# Patient Record
Sex: Male | Born: 1995 | Race: White | Hispanic: No | Marital: Single | Smoking: Never smoker
Health system: Southern US, Community
[De-identification: ages and names within clinical notes are randomized; demographics above are authoritative.]

## PROBLEM LIST (undated history)

## (undated) DIAGNOSIS — F840 Autistic disorder: Secondary | ICD-10-CM

## (undated) DIAGNOSIS — D169 Benign neoplasm of bone and articular cartilage, unspecified: Secondary | ICD-10-CM

## (undated) HISTORY — PX: DENTAL SURGERY: SHX609

## (undated) HISTORY — PX: OTHER SURGICAL HISTORY: SHX169

---
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
## 2017-02-18 DIAGNOSIS — H6121 Impacted cerumen, right ear: Secondary | ICD-10-CM | POA: Diagnosis not present

## 2017-03-28 DIAGNOSIS — R21 Rash and other nonspecific skin eruption: Secondary | ICD-10-CM | POA: Diagnosis not present

## 2017-09-06 DIAGNOSIS — H1032 Unspecified acute conjunctivitis, left eye: Secondary | ICD-10-CM | POA: Diagnosis not present

## 2017-09-06 DIAGNOSIS — H6123 Impacted cerumen, bilateral: Secondary | ICD-10-CM | POA: Diagnosis not present

## 2017-10-07 ENCOUNTER — Other Ambulatory Visit: Payer: Self-pay

## 2017-10-07 ENCOUNTER — Encounter (HOSPITAL_COMMUNITY): Payer: Self-pay

## 2017-10-07 ENCOUNTER — Emergency Department (HOSPITAL_COMMUNITY)
Admission: EM | Admit: 2017-10-07 | Discharge: 2017-10-07 | Disposition: A | Discharge status: Home / Self Care | Payer: 59 | Attending: Emergency Medicine | Admitting: Emergency Medicine

## 2017-10-07 DIAGNOSIS — T7840XA Allergy, unspecified, initial encounter: Secondary | ICD-10-CM

## 2017-10-07 DIAGNOSIS — T7805XA Anaphylactic reaction due to tree nuts and seeds, initial encounter: Secondary | ICD-10-CM | POA: Diagnosis not present

## 2017-10-07 DIAGNOSIS — Z91010 Allergy to peanuts: Secondary | ICD-10-CM | POA: Insufficient documentation

## 2017-10-07 DIAGNOSIS — F840 Autistic disorder: Secondary | ICD-10-CM | POA: Diagnosis not present

## 2017-10-07 HISTORY — DX: Autistic disorder: F84.0

## 2017-10-07 HISTORY — DX: Benign neoplasm of bone and articular cartilage, unspecified: D16.9

## 2017-10-07 MED ORDER — PREDNISONE 20 MG PO TABS: 40.0000 mg | ORAL_TABLET | Freq: Every day | ORAL | 0 refills | Status: AC

## 2017-10-07 MED ORDER — PREDNISONE 20 MG PO TABS
40.0000 mg | ORAL_TABLET | Freq: Once | ORAL | Status: AC
  Administered 2017-10-07: 40 mg via ORAL
  Filled 2017-10-07: qty 2

## 2017-10-07 MED ORDER — FAMOTIDINE 20 MG PO TABS
20.0000 mg | ORAL_TABLET | Freq: Once | ORAL | Status: AC
  Administered 2017-10-07: 20 mg via ORAL
  Filled 2017-10-07: qty 1

## 2017-10-07 MED ORDER — DIPHENHYDRAMINE HCL 25 MG PO CAPS
25.0000 mg | ORAL_CAPSULE | Freq: Once | ORAL | Status: AC
  Administered 2017-10-07: 25 mg via ORAL
  Filled 2017-10-07: qty 1

## 2017-10-07 MED ORDER — EPINEPHRINE 0.3 MG/0.3ML IJ SOAJ: 0.3000 mg | Freq: Once | INTRAMUSCULAR | 0 refills | Status: AC

## 2017-10-07 NOTE — ED Provider Notes (Signed)
[facility] DEPT Provider Note   CSN: [id number] Arrival date & time: 10/07/17  1629     History   Chief Complaint Chief Complaint  Patient presents with  . Allergic Reaction    HPI William Herrera is a 21 y.o. male.  HPI  21 y.o. male with a hx of Autism, presents to the Emergency Department today due to allergic reaction. States that around 1530 he was at work and saw patient eating cashews, which he is allergic to, and start using equipment. Notes using same equipment with hands and started to develop hives on BUE as well as urticarial rash on torso and anterior neck. Denies throat swelling or shortness of breath. Pt states he did not ingest said cashews either. Pt did not take any medications PTA. States symptoms are slowly improving. Denies CP/SOB/ABD pain. No fevers. No neck stiffness or swelling. Pt without Epi pen at this time. No other symptoms noted    Past Medical History:  Diagnosis Date  . Autism    high functioning  . Bone tumor (benign)     There are no active problems to display for this patient.   Past Surgical History:  Procedure Laterality Date  . DENTAL SURGERY    . removal of bone tumors         Home Medications    Prior to Admission medications   Not on File    Family History Family History  Problem Relation Age of Onset  . Crohn's disease Mother   . Hypertension Father     Social History Social History   Tobacco Use  . Smoking status: Never Smoker  . Smokeless tobacco: Never Used  Substance Use Topics  . Alcohol use: No    Frequency: Never  . Drug use: No     Allergies   Cashew nut oil; Other; and Peanut-containing drug products   Review of Systems Review of Systems ROS reviewed and all are negative for acute change except as noted in the HPI.  Physical Exam Updated Vital Signs BP 130/80 (BP Location: Left Arm)   Pulse 100   Temp 97.7 F (36.5 C) (Oral)   Resp 18   Ht 5\' 5"  (1.651 m)    Wt 54.4 kg (120 lb)   SpO2 100%   BMI 19.97 kg/m   Physical Exam  Constitutional: He is oriented to person, place, and time. He appears well-developed and well-nourished. No distress.  HENT:  Head: Normocephalic and atraumatic.  Right Ear: Tympanic membrane, external ear and ear canal normal.  Left Ear: Tympanic membrane, external ear and ear canal normal.  Nose: Nose normal.  Mouth/Throat: Uvula is midline, oropharynx is clear and moist and mucous membranes are normal. No trismus in the jaw. No oropharyngeal exudate, posterior oropharyngeal erythema or tonsillar abscesses.  No posterior oropharyngeal swelling. Speaking in clear sentences   Eyes: EOM are normal. Pupils are equal, round, and reactive to light.  Neck: Normal range of motion. Neck supple. No tracheal deviation present.  Cardiovascular: Normal rate, regular rhythm, S1 normal, S2 normal, normal heart sounds, intact distal pulses and normal pulses.  Pulmonary/Chest: Effort normal and breath sounds normal. No respiratory distress. He has no decreased breath sounds. He has no wheezes. He has no rhonchi. He has no rales.  Abdominal: Normal appearance and bowel sounds are normal. There is no tenderness.  Musculoskeletal: Normal range of motion.  Neurological: He is alert and oriented to person, place, and time.  Skin: Skin is warm  and dry.  Urticarial rash noted on anterior chest wall   Psychiatric: He has a normal mood and affect. His speech is normal and behavior is normal. Thought content normal.  Vitals reviewed.    ED Treatments / Results  Labs (all labs ordered are listed, but only abnormal results are displayed) Labs Reviewed - No data to display  EKG  EKG Interpretation None       Radiology No results found.  Procedures Procedures (including critical care time)  Medications Ordered in ED Medications - No data to display   Initial Impression / Assessment and Plan / ED Course  I have reviewed the  triage vital signs and the nursing notes.  Pertinent labs & imaging results that were available during my care of the patient were reviewed by me and considered in my medical decision making (see chart for details).  Final Clinical Impressions(s) / ED Diagnoses     {I have reviewed the relevant previous healthcare records.  {I obtained HPI from historian.   ED Course:  Assessment: Pt is a 21 y.o. male with a hx of Autism, presents to the Emergency Department today due to allergic reaction. States that around 1530 he was at work and saw patient eating cashews, which he is allergic to, and start using equipment. Notes using same equipment with hands and started to develop hives on BUE as well as urticarial rash on torso and anterior neck. Denies throat swelling or shortness of breath. Pt states he did not ingest said cashews either. Pt did not take any medications PTA. States symptoms are slowly improving. Denies CP/SOB/ABD pain. No fevers. No neck stiffness or swelling. Pt without Epi pen at this time. On exam, pt in NAD. Nontoxic/nonseptic appearing. VSS. Afebrile. No posterior oropharyngeal swelling. Speaking in clear sentences.  Lungs CTA. Heart RRR. Abdomen nontender soft. Given prednisone, benadryl and famotidine in ED. Observed with improvement. Plan is to DC home with steroids and epi pen. At time of discharge, Patient is in no acute distress. Vital Signs are stable. Patient is able to ambulate. Patient able to tolerate PO.   Disposition/Plan:  DC Home Additional Verbal discharge instructions given and discussed with patient.  Pt Instructed to f/u with PCP in the next week for evaluation and treatment of symptoms. Return precautions given Pt acknowledges and agrees with plan  Supervising Physician Valarie Merino, MD  Final diagnoses:  Allergic reaction, initial encounter    ED Discharge Orders    None       Shary Decamp, PA-C 10/07/17 1816    Valarie Merino, MD 10/07/17  2329

## 2017-10-07 NOTE — ED Triage Notes (Signed)
Patient c/o allergic reaction at 1530. Patient has hives, slight lip swelling, and feels like he has resistance when he exhales. Patient has redness of the neck, and some on the trunk.

## 2017-10-07 NOTE — Discharge Instructions (Signed)
Please read and follow all provided instructions.  Your diagnoses today include:  1. Allergic reaction, initial encounter     Tests performed today include: Vital signs. See below for your results today.   Medications prescribed:  Take as prescribed   Home care instructions:  Follow any educational materials contained in this packet.  Follow-up instructions: Please follow-up with your primary care provider for further evaluation of symptoms and treatment   Return instructions:  Please return to the Emergency Department if you do not get better, if you get worse, or new symptoms OR  - Fever (temperature greater than 101.41F)  - Bleeding that does not stop with holding pressure to the area    -Severe pain (please note that you may be more sore the day after your accident)  - Chest Pain  - Difficulty breathing  - Severe nausea or vomiting  - Inability to tolerate food and liquids  - Passing out  - Skin becoming red around your wounds  - Change in mental status (confusion or lethargy)  - New numbness or weakness    Please return if you have any other emergent concerns.  Additional Information:  Your vital signs today were: BP 130/80 (BP Location: Left Arm)    Pulse 100    Temp 97.7 F (36.5 C) (Oral)    Resp 18    Ht 5\' 5"  (1.651 m)    Wt 54.4 kg (120 lb)    SpO2 100%    BMI 19.97 kg/m  If your blood pressure (BP) was elevated above 135/85 this visit, please have this repeated by your doctor within one month. ---------------

## 2017-10-31 DIAGNOSIS — J069 Acute upper respiratory infection, unspecified: Secondary | ICD-10-CM | POA: Diagnosis not present

## 2017-10-31 DIAGNOSIS — H9203 Otalgia, bilateral: Secondary | ICD-10-CM | POA: Diagnosis not present

## 2018-02-04 DIAGNOSIS — H6121 Impacted cerumen, right ear: Secondary | ICD-10-CM | POA: Diagnosis not present

## 2018-02-04 DIAGNOSIS — H6122 Impacted cerumen, left ear: Secondary | ICD-10-CM | POA: Diagnosis not present

## 2018-02-04 DIAGNOSIS — R197 Diarrhea, unspecified: Secondary | ICD-10-CM | POA: Diagnosis not present

## 2018-04-01 DIAGNOSIS — M25551 Pain in right hip: Secondary | ICD-10-CM | POA: Diagnosis not present

## 2018-04-01 DIAGNOSIS — M25561 Pain in right knee: Secondary | ICD-10-CM | POA: Diagnosis not present

## 2018-04-01 DIAGNOSIS — M25562 Pain in left knee: Secondary | ICD-10-CM | POA: Diagnosis not present

## 2018-04-08 DIAGNOSIS — M222X2 Patellofemoral disorders, left knee: Secondary | ICD-10-CM | POA: Diagnosis not present

## 2018-05-21 DIAGNOSIS — Z68.41 Body mass index (BMI) pediatric, 5th percentile to less than 85th percentile for age: Secondary | ICD-10-CM | POA: Diagnosis not present

## 2018-05-21 DIAGNOSIS — Z0001 Encounter for general adult medical examination with abnormal findings: Secondary | ICD-10-CM | POA: Diagnosis not present

## 2018-05-21 DIAGNOSIS — Z713 Dietary counseling and surveillance: Secondary | ICD-10-CM | POA: Diagnosis not present

## 2018-06-04 DIAGNOSIS — Z111 Encounter for screening for respiratory tuberculosis: Secondary | ICD-10-CM | POA: Diagnosis not present

## 2018-08-05 DIAGNOSIS — J309 Allergic rhinitis, unspecified: Secondary | ICD-10-CM | POA: Diagnosis not present

## 2018-08-05 DIAGNOSIS — H6121 Impacted cerumen, right ear: Secondary | ICD-10-CM | POA: Diagnosis not present

## 2018-08-05 DIAGNOSIS — H6093 Unspecified otitis externa, bilateral: Secondary | ICD-10-CM | POA: Diagnosis not present

## 2018-12-18 DIAGNOSIS — F840 Autistic disorder: Secondary | ICD-10-CM | POA: Diagnosis not present

## 2019-01-06 DIAGNOSIS — F840 Autistic disorder: Secondary | ICD-10-CM | POA: Diagnosis not present

## 2019-01-09 DIAGNOSIS — H6123 Impacted cerumen, bilateral: Secondary | ICD-10-CM | POA: Diagnosis not present

## 2019-01-22 DIAGNOSIS — F40218 Other animal type phobia: Secondary | ICD-10-CM | POA: Diagnosis not present

## 2019-01-22 DIAGNOSIS — F840 Autistic disorder: Secondary | ICD-10-CM | POA: Diagnosis not present

## 2019-06-04 DIAGNOSIS — J029 Acute pharyngitis, unspecified: Secondary | ICD-10-CM | POA: Diagnosis not present

## 2019-06-04 DIAGNOSIS — J309 Allergic rhinitis, unspecified: Secondary | ICD-10-CM | POA: Diagnosis not present

## 2019-07-28 DIAGNOSIS — R194 Change in bowel habit: Secondary | ICD-10-CM | POA: Diagnosis not present

## 2019-07-28 DIAGNOSIS — R140 Abdominal distension (gaseous): Secondary | ICD-10-CM | POA: Diagnosis not present

## 2019-07-28 DIAGNOSIS — R740 Nonspecific elevation of levels of transaminase and lactic acid dehydrogenase [LDH]: Secondary | ICD-10-CM | POA: Diagnosis not present

## 2019-07-28 DIAGNOSIS — K219 Gastro-esophageal reflux disease without esophagitis: Secondary | ICD-10-CM | POA: Diagnosis not present

## 2019-07-28 DIAGNOSIS — Z8379 Family history of other diseases of the digestive system: Secondary | ICD-10-CM | POA: Diagnosis not present

## 2019-07-31 DIAGNOSIS — R140 Abdominal distension (gaseous): Secondary | ICD-10-CM | POA: Diagnosis not present

## 2019-07-31 DIAGNOSIS — K219 Gastro-esophageal reflux disease without esophagitis: Secondary | ICD-10-CM | POA: Diagnosis not present

## 2019-07-31 DIAGNOSIS — R194 Change in bowel habit: Secondary | ICD-10-CM | POA: Diagnosis not present

## 2019-07-31 DIAGNOSIS — Z8379 Family history of other diseases of the digestive system: Secondary | ICD-10-CM | POA: Diagnosis not present

## 2019-08-04 ENCOUNTER — Other Ambulatory Visit: Payer: Self-pay | Admitting: Gastroenterology

## 2019-08-04 DIAGNOSIS — R7989 Other specified abnormal findings of blood chemistry: Secondary | ICD-10-CM

## 2019-08-06 ENCOUNTER — Ambulatory Visit
Admission: RE | Admit: 2019-08-06 | Discharge: 2019-08-06 | Disposition: A | Discharge status: Home / Self Care | Payer: BC Managed Care – PPO | Source: Ambulatory Visit | Attending: Gastroenterology | Admitting: Gastroenterology

## 2019-08-06 DIAGNOSIS — K760 Fatty (change of) liver, not elsewhere classified: Secondary | ICD-10-CM | POA: Diagnosis not present

## 2019-08-06 DIAGNOSIS — R7989 Other specified abnormal findings of blood chemistry: Secondary | ICD-10-CM

## 2019-08-18 DIAGNOSIS — M25561 Pain in right knee: Secondary | ICD-10-CM | POA: Diagnosis not present

## 2019-08-18 DIAGNOSIS — Q786 Multiple congenital exostoses: Secondary | ICD-10-CM | POA: Diagnosis not present

## 2019-08-20 DIAGNOSIS — Z8379 Family history of other diseases of the digestive system: Secondary | ICD-10-CM | POA: Diagnosis not present

## 2019-08-20 DIAGNOSIS — R748 Abnormal levels of other serum enzymes: Secondary | ICD-10-CM | POA: Diagnosis not present

## 2019-08-20 DIAGNOSIS — A0472 Enterocolitis due to Clostridium difficile, not specified as recurrent: Secondary | ICD-10-CM | POA: Diagnosis not present

## 2019-08-27 DIAGNOSIS — M25561 Pain in right knee: Secondary | ICD-10-CM | POA: Diagnosis not present

## 2019-09-03 DIAGNOSIS — Q786 Multiple congenital exostoses: Secondary | ICD-10-CM | POA: Diagnosis not present

## 2020-02-09 DIAGNOSIS — Z91010 Allergy to peanuts: Secondary | ICD-10-CM | POA: Diagnosis not present

## 2020-02-09 DIAGNOSIS — J3081 Allergic rhinitis due to animal (cat) (dog) hair and dander: Secondary | ICD-10-CM | POA: Diagnosis not present

## 2020-02-09 DIAGNOSIS — J3089 Other allergic rhinitis: Secondary | ICD-10-CM | POA: Diagnosis not present

## 2020-02-09 DIAGNOSIS — Z91018 Allergy to other foods: Secondary | ICD-10-CM | POA: Diagnosis not present

## 2020-05-31 DIAGNOSIS — Z23 Encounter for immunization: Secondary | ICD-10-CM | POA: Diagnosis not present

## 2020-05-31 DIAGNOSIS — Z0000 Encounter for general adult medical examination without abnormal findings: Secondary | ICD-10-CM | POA: Diagnosis not present

## 2020-06-21 DIAGNOSIS — Z20828 Contact with and (suspected) exposure to other viral communicable diseases: Secondary | ICD-10-CM | POA: Diagnosis not present

## 2020-07-12 DIAGNOSIS — M25562 Pain in left knee: Secondary | ICD-10-CM | POA: Diagnosis not present

## 2020-08-30 DIAGNOSIS — H6123 Impacted cerumen, bilateral: Secondary | ICD-10-CM | POA: Diagnosis not present

## 2021-01-10 ENCOUNTER — Other Ambulatory Visit: Payer: Self-pay | Admitting: Family Medicine

## 2021-01-10 ENCOUNTER — Ambulatory Visit
Admission: RE | Admit: 2021-01-10 | Discharge: 2021-01-10 | Disposition: A | Discharge status: Home / Self Care | Payer: BC Managed Care – PPO | Source: Ambulatory Visit | Attending: Family Medicine | Admitting: Family Medicine

## 2021-01-10 DIAGNOSIS — M7989 Other specified soft tissue disorders: Secondary | ICD-10-CM | POA: Diagnosis not present

## 2021-01-10 DIAGNOSIS — M79675 Pain in left toe(s): Secondary | ICD-10-CM | POA: Diagnosis not present

## 2021-08-03 DIAGNOSIS — F840 Autistic disorder: Secondary | ICD-10-CM | POA: Diagnosis not present

## 2021-08-03 DIAGNOSIS — F0630 Mood disorder due to known physiological condition, unspecified: Secondary | ICD-10-CM | POA: Diagnosis not present

## 2021-09-06 DIAGNOSIS — F0630 Mood disorder due to known physiological condition, unspecified: Secondary | ICD-10-CM | POA: Diagnosis not present

## 2021-09-06 DIAGNOSIS — F840 Autistic disorder: Secondary | ICD-10-CM | POA: Diagnosis not present

## 2021-10-05 DIAGNOSIS — F0630 Mood disorder due to known physiological condition, unspecified: Secondary | ICD-10-CM | POA: Diagnosis not present

## 2021-10-05 DIAGNOSIS — F840 Autistic disorder: Secondary | ICD-10-CM | POA: Diagnosis not present

## 2021-11-16 DIAGNOSIS — F840 Autistic disorder: Secondary | ICD-10-CM | POA: Diagnosis not present

## 2021-11-16 DIAGNOSIS — F0630 Mood disorder due to known physiological condition, unspecified: Secondary | ICD-10-CM | POA: Diagnosis not present

## 2021-11-30 DIAGNOSIS — Q786 Multiple congenital exostoses: Secondary | ICD-10-CM | POA: Diagnosis not present

## 2021-11-30 DIAGNOSIS — Z0000 Encounter for general adult medical examination without abnormal findings: Secondary | ICD-10-CM | POA: Diagnosis not present

## 2021-11-30 DIAGNOSIS — R7401 Elevation of levels of liver transaminase levels: Secondary | ICD-10-CM | POA: Diagnosis not present

## 2021-11-30 DIAGNOSIS — R7989 Other specified abnormal findings of blood chemistry: Secondary | ICD-10-CM | POA: Diagnosis not present

## 2021-12-14 DIAGNOSIS — F0630 Mood disorder due to known physiological condition, unspecified: Secondary | ICD-10-CM | POA: Diagnosis not present

## 2021-12-14 DIAGNOSIS — F840 Autistic disorder: Secondary | ICD-10-CM | POA: Diagnosis not present

## 2021-12-22 DIAGNOSIS — F0630 Mood disorder due to known physiological condition, unspecified: Secondary | ICD-10-CM | POA: Diagnosis not present

## 2021-12-22 DIAGNOSIS — F840 Autistic disorder: Secondary | ICD-10-CM | POA: Diagnosis not present

## 2021-12-28 DIAGNOSIS — Z23 Encounter for immunization: Secondary | ICD-10-CM | POA: Diagnosis not present

## 2022-02-01 DIAGNOSIS — F840 Autistic disorder: Secondary | ICD-10-CM | POA: Diagnosis not present

## 2022-02-01 DIAGNOSIS — F0630 Mood disorder due to known physiological condition, unspecified: Secondary | ICD-10-CM | POA: Diagnosis not present

## 2022-02-12 DIAGNOSIS — H6121 Impacted cerumen, right ear: Secondary | ICD-10-CM | POA: Diagnosis not present

## 2022-03-05 DIAGNOSIS — Z23 Encounter for immunization: Secondary | ICD-10-CM | POA: Diagnosis not present

## 2022-03-09 DIAGNOSIS — F0630 Mood disorder due to known physiological condition, unspecified: Secondary | ICD-10-CM | POA: Diagnosis not present

## 2022-03-09 DIAGNOSIS — F840 Autistic disorder: Secondary | ICD-10-CM | POA: Diagnosis not present

## 2022-03-19 DIAGNOSIS — F840 Autistic disorder: Secondary | ICD-10-CM | POA: Diagnosis not present

## 2022-03-19 DIAGNOSIS — F0630 Mood disorder due to known physiological condition, unspecified: Secondary | ICD-10-CM | POA: Diagnosis not present

## 2022-03-19 IMAGING — DX DG FOOT COMPLETE 3+V*L*
3 series · 3 of 3 positions shown · non-contrast
Comparison: None.

CLINICAL DATA: Swelling fifth toe with pain

EXAM:
LEFT FOOT - COMPLETE 3+ VIEW

[dg foot 2 views left (1 of 2)]
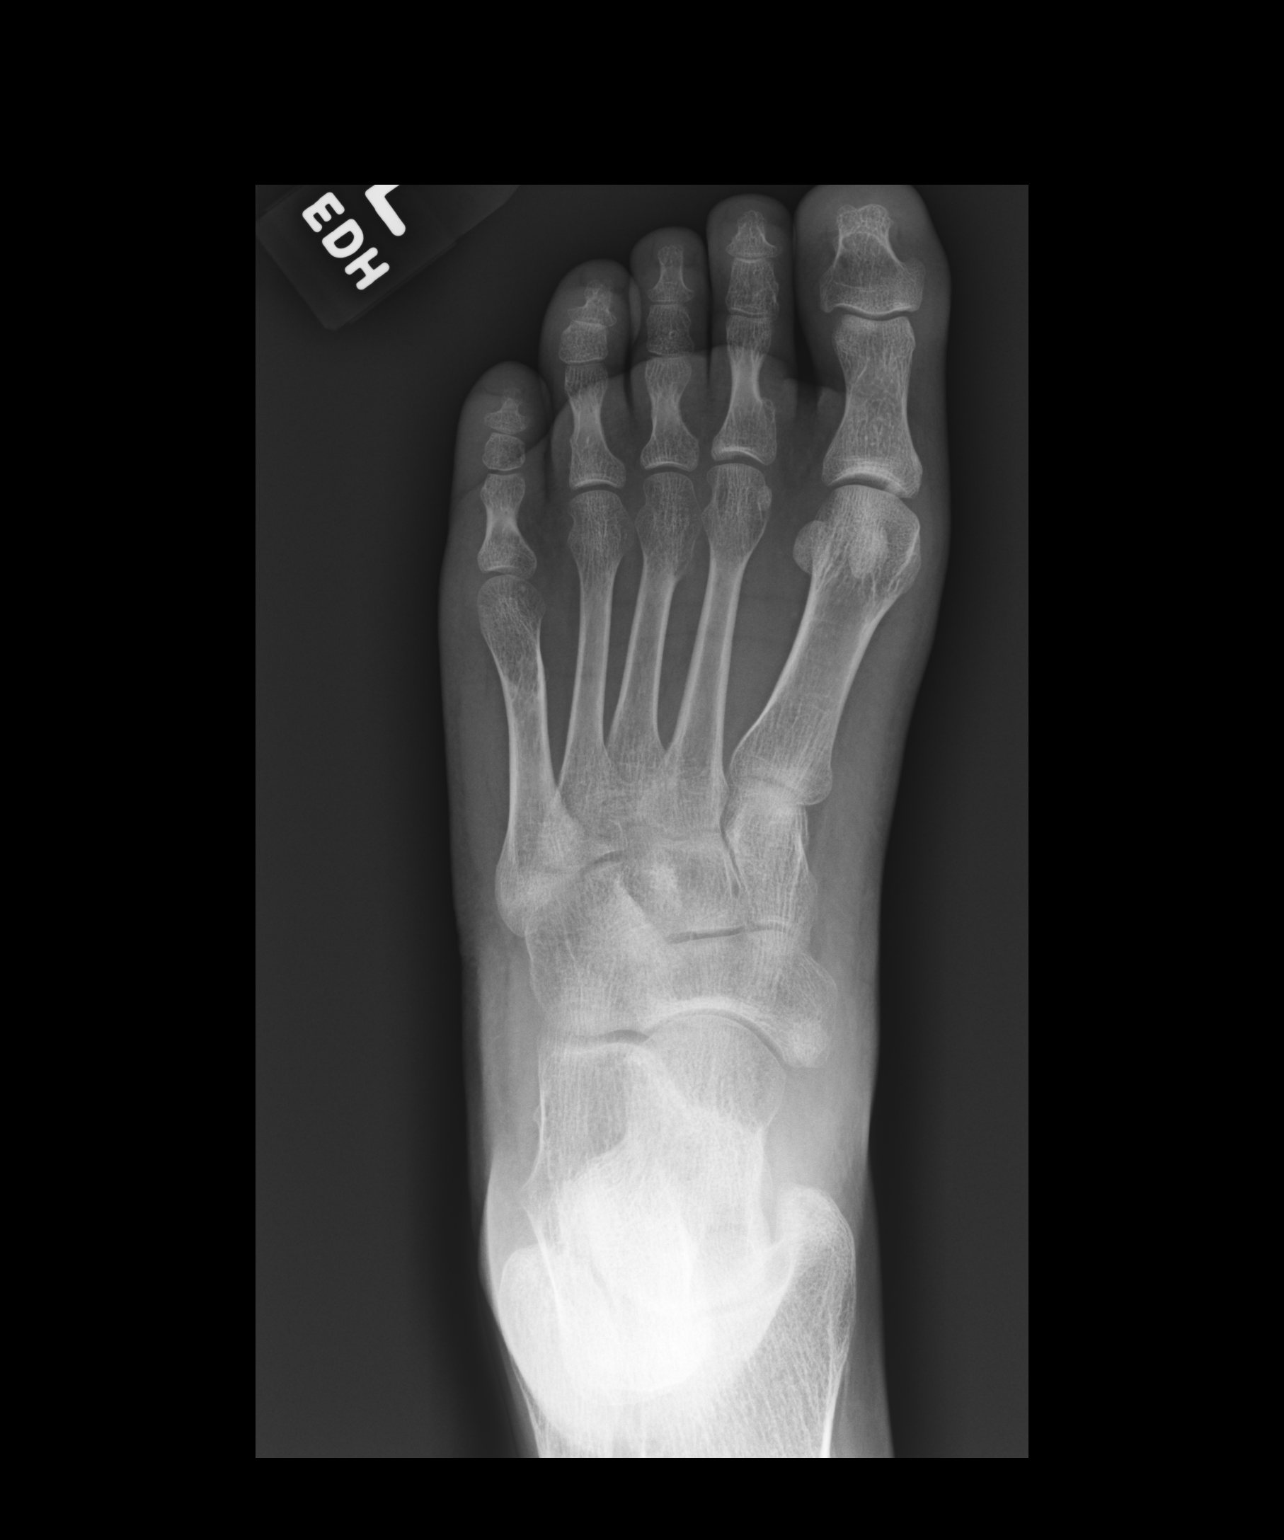

[dg foot 2 views left (2 of 2)]
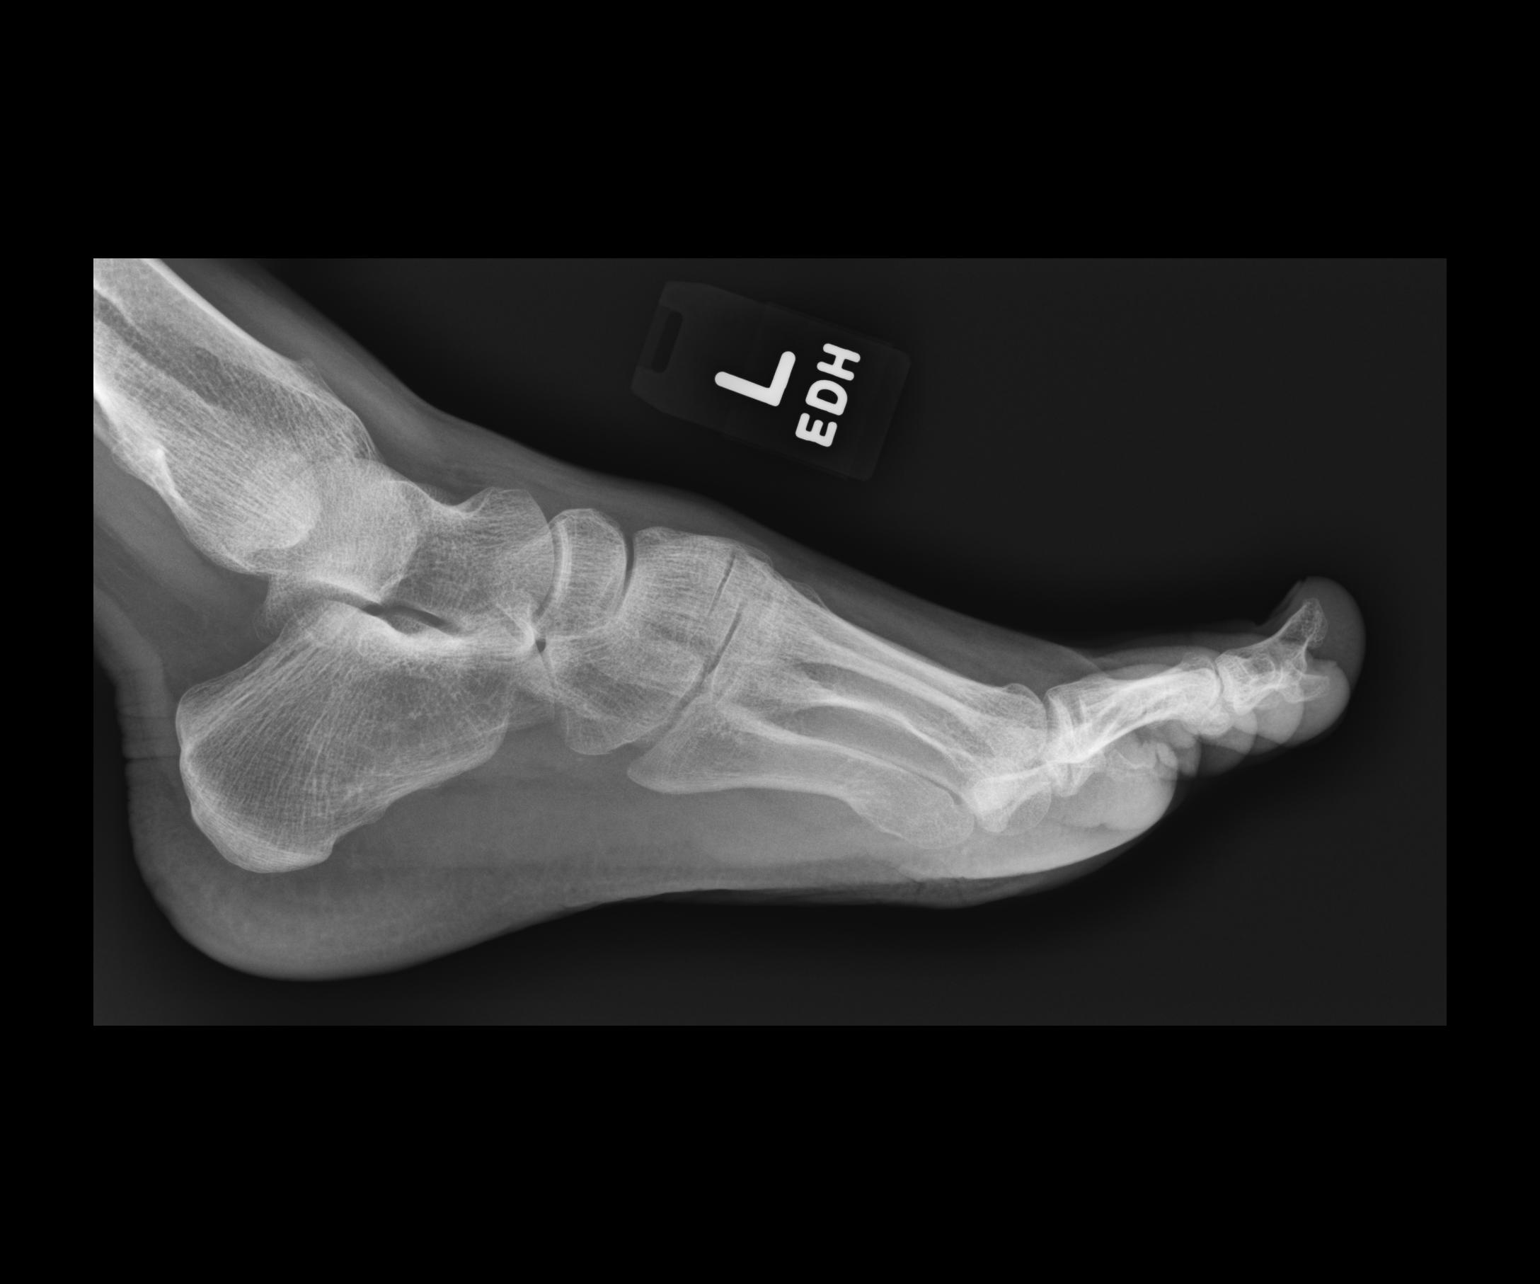

[view not recorded]
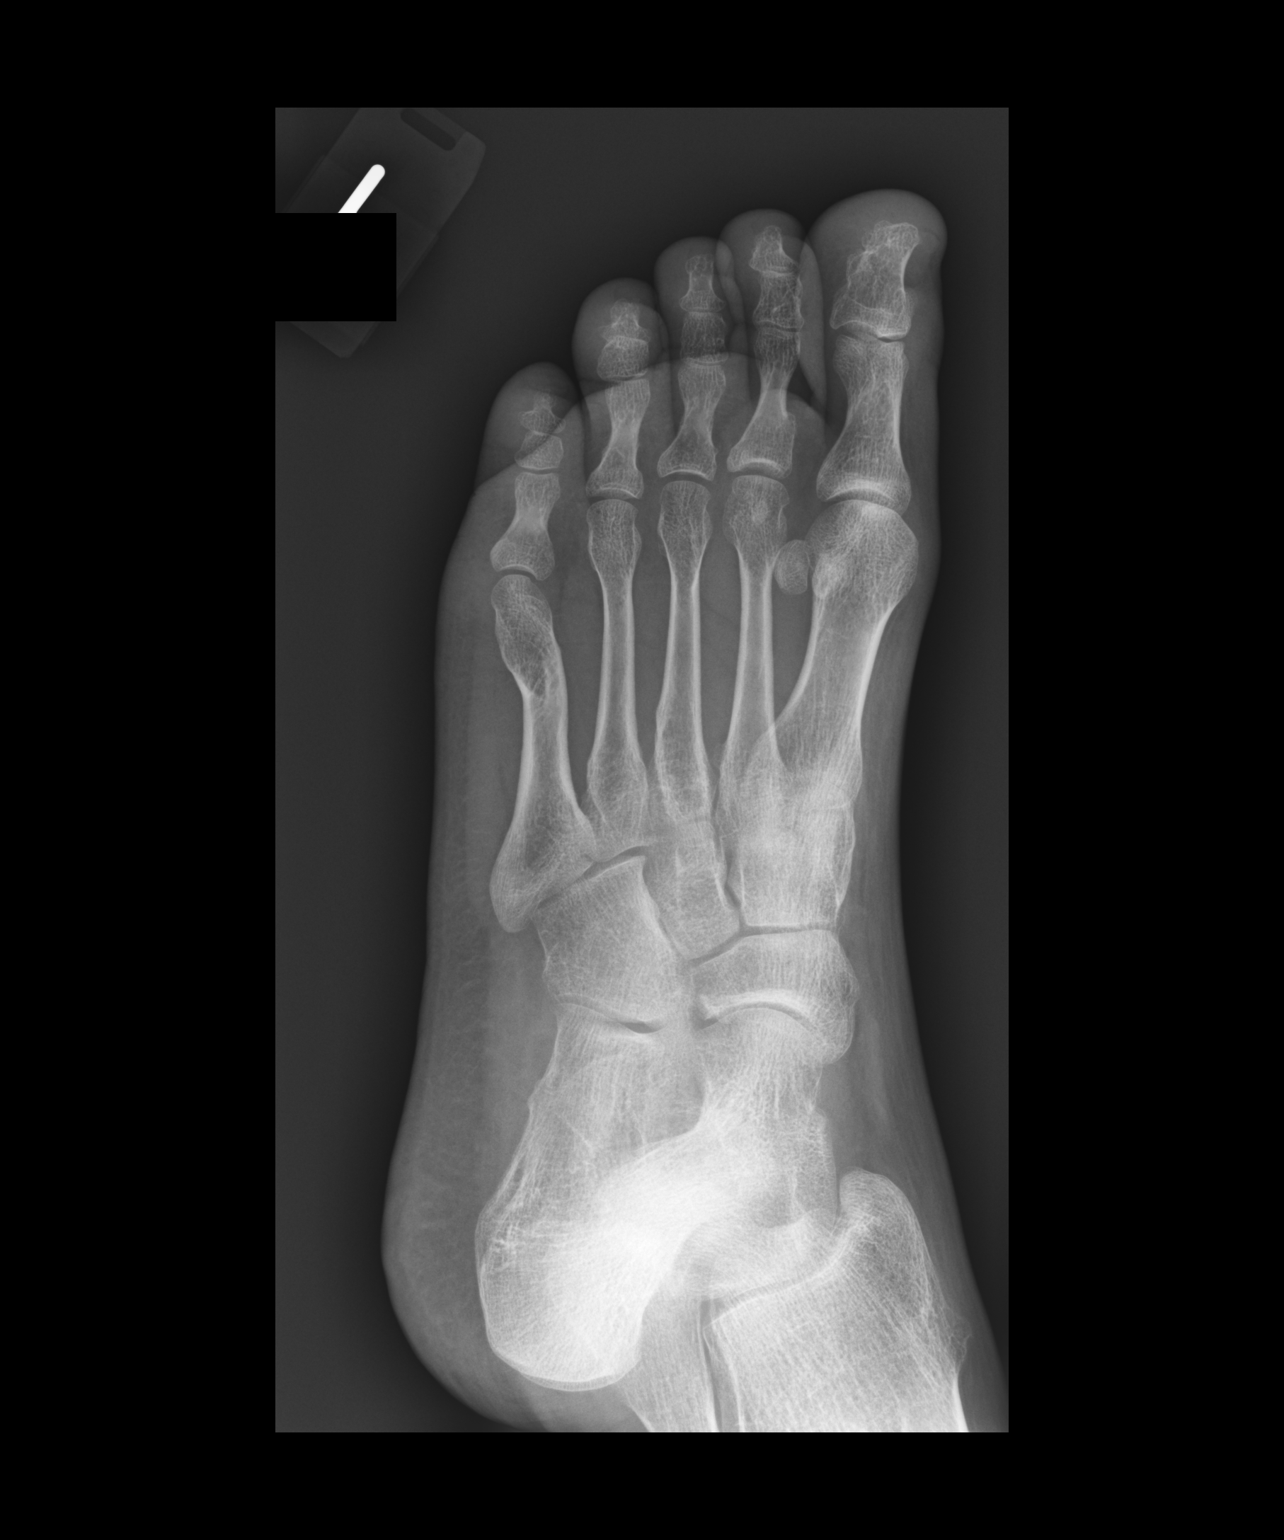

[3 of 3 positions shown; findings below may reference images not displayed]

FINDINGS: There is no evidence of fracture or dislocation. There is no
evidence of arthropathy or other focal bone abnormality. Soft
tissues are unremarkable.
IMPRESSION: Negative.

## 2022-04-27 DIAGNOSIS — F0630 Mood disorder due to known physiological condition, unspecified: Secondary | ICD-10-CM | POA: Diagnosis not present

## 2022-04-27 DIAGNOSIS — F840 Autistic disorder: Secondary | ICD-10-CM | POA: Diagnosis not present

## 2022-06-08 DIAGNOSIS — F840 Autistic disorder: Secondary | ICD-10-CM | POA: Diagnosis not present

## 2022-06-08 DIAGNOSIS — F0630 Mood disorder due to known physiological condition, unspecified: Secondary | ICD-10-CM | POA: Diagnosis not present

## 2022-07-09 DIAGNOSIS — Z23 Encounter for immunization: Secondary | ICD-10-CM | POA: Diagnosis not present

## 2022-07-18 DIAGNOSIS — Q786 Multiple congenital exostoses: Secondary | ICD-10-CM | POA: Diagnosis not present

## 2022-07-18 DIAGNOSIS — M25561 Pain in right knee: Secondary | ICD-10-CM | POA: Diagnosis not present

## 2022-07-19 DIAGNOSIS — F840 Autistic disorder: Secondary | ICD-10-CM | POA: Diagnosis not present

## 2022-07-19 DIAGNOSIS — F0630 Mood disorder due to known physiological condition, unspecified: Secondary | ICD-10-CM | POA: Diagnosis not present

## 2022-08-08 DIAGNOSIS — Q786 Multiple congenital exostoses: Secondary | ICD-10-CM | POA: Diagnosis not present

## 2022-08-27 DIAGNOSIS — D1621 Benign neoplasm of long bones of right lower limb: Secondary | ICD-10-CM | POA: Diagnosis not present

## 2022-08-27 DIAGNOSIS — M898X6 Other specified disorders of bone, lower leg: Secondary | ICD-10-CM | POA: Diagnosis not present

## 2022-08-27 DIAGNOSIS — G8918 Other acute postprocedural pain: Secondary | ICD-10-CM | POA: Diagnosis not present

## 2022-09-25 DIAGNOSIS — M25661 Stiffness of right knee, not elsewhere classified: Secondary | ICD-10-CM | POA: Diagnosis not present

## 2022-09-28 DIAGNOSIS — M25561 Pain in right knee: Secondary | ICD-10-CM | POA: Diagnosis not present

## 2022-09-28 DIAGNOSIS — Q786 Multiple congenital exostoses: Secondary | ICD-10-CM | POA: Diagnosis not present

## 2022-10-02 DIAGNOSIS — M25561 Pain in right knee: Secondary | ICD-10-CM | POA: Diagnosis not present

## 2022-10-02 DIAGNOSIS — Q786 Multiple congenital exostoses: Secondary | ICD-10-CM | POA: Diagnosis not present

## 2022-10-04 DIAGNOSIS — M25561 Pain in right knee: Secondary | ICD-10-CM | POA: Diagnosis not present

## 2022-10-04 DIAGNOSIS — Q786 Multiple congenital exostoses: Secondary | ICD-10-CM | POA: Diagnosis not present

## 2022-10-09 DIAGNOSIS — M25561 Pain in right knee: Secondary | ICD-10-CM | POA: Diagnosis not present

## 2022-10-09 DIAGNOSIS — Q786 Multiple congenital exostoses: Secondary | ICD-10-CM | POA: Diagnosis not present

## 2022-12-05 DIAGNOSIS — M79661 Pain in right lower leg: Secondary | ICD-10-CM | POA: Diagnosis not present

## 2022-12-06 DIAGNOSIS — Z13220 Encounter for screening for lipoid disorders: Secondary | ICD-10-CM | POA: Diagnosis not present

## 2022-12-06 DIAGNOSIS — R7301 Impaired fasting glucose: Secondary | ICD-10-CM | POA: Diagnosis not present

## 2022-12-06 DIAGNOSIS — Z0000 Encounter for general adult medical examination without abnormal findings: Secondary | ICD-10-CM | POA: Diagnosis not present

## 2022-12-06 DIAGNOSIS — R945 Abnormal results of liver function studies: Secondary | ICD-10-CM | POA: Diagnosis not present

## 2022-12-25 DIAGNOSIS — Z1331 Encounter for screening for depression: Secondary | ICD-10-CM | POA: Diagnosis not present

## 2022-12-25 DIAGNOSIS — F840 Autistic disorder: Secondary | ICD-10-CM | POA: Diagnosis not present

## 2022-12-25 DIAGNOSIS — F0634 Mood disorder due to known physiological condition with mixed features: Secondary | ICD-10-CM | POA: Diagnosis not present

## 2023-01-14 ENCOUNTER — Inpatient Hospital Stay: Payer: BC Managed Care – PPO

## 2023-01-14 ENCOUNTER — Inpatient Hospital Stay: Payer: BC Managed Care – PPO | Admitting: Licensed Clinical Social Worker

## 2023-02-05 DIAGNOSIS — H6123 Impacted cerumen, bilateral: Secondary | ICD-10-CM | POA: Diagnosis not present

## 2023-02-05 DIAGNOSIS — H6990 Unspecified Eustachian tube disorder, unspecified ear: Secondary | ICD-10-CM | POA: Diagnosis not present

## 2023-03-26 DIAGNOSIS — F840 Autistic disorder: Secondary | ICD-10-CM | POA: Diagnosis not present

## 2023-03-26 DIAGNOSIS — F0634 Mood disorder due to known physiological condition with mixed features: Secondary | ICD-10-CM | POA: Diagnosis not present

## 2023-06-25 DIAGNOSIS — F0634 Mood disorder due to known physiological condition with mixed features: Secondary | ICD-10-CM | POA: Diagnosis not present

## 2023-06-25 DIAGNOSIS — F840 Autistic disorder: Secondary | ICD-10-CM | POA: Diagnosis not present

## 2023-08-23 DIAGNOSIS — F0634 Mood disorder due to known physiological condition with mixed features: Secondary | ICD-10-CM | POA: Diagnosis not present

## 2023-08-23 DIAGNOSIS — F840 Autistic disorder: Secondary | ICD-10-CM | POA: Diagnosis not present

## 2023-11-06 DIAGNOSIS — M25561 Pain in right knee: Secondary | ICD-10-CM | POA: Diagnosis not present

## 2023-11-22 DIAGNOSIS — F840 Autistic disorder: Secondary | ICD-10-CM | POA: Diagnosis not present

## 2023-11-22 DIAGNOSIS — F0634 Mood disorder due to known physiological condition with mixed features: Secondary | ICD-10-CM | POA: Diagnosis not present

## 2023-11-28 DIAGNOSIS — W5501XA Bitten by cat, initial encounter: Secondary | ICD-10-CM | POA: Diagnosis not present

## 2023-11-28 DIAGNOSIS — S61230A Puncture wound without foreign body of right index finger without damage to nail, initial encounter: Secondary | ICD-10-CM | POA: Diagnosis not present

## 2023-12-04 ENCOUNTER — Encounter: Payer: Self-pay | Admitting: Medical Genetics

## 2023-12-04 ENCOUNTER — Ambulatory Visit: Payer: BC Managed Care – PPO | Admitting: Medical Genetics

## 2023-12-04 DIAGNOSIS — F840 Autistic disorder: Secondary | ICD-10-CM | POA: Diagnosis not present

## 2023-12-04 DIAGNOSIS — D169 Benign neoplasm of bone and articular cartilage, unspecified: Secondary | ICD-10-CM | POA: Diagnosis not present

## 2023-12-04 NOTE — Progress Notes (Signed)
 GENETIC COUNSELING NEW PATIENT EVALUATION Patient name: William Herrera DOB: [birth year] Age: 28 y.o. 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 He is accompanied by his parents at today's visit.  Prior genetic testing has not been performed.  Family History: See pedigree obtained during today's visit under History->Family->Pedigree.  The family history was notable for the following: Sister, 53 yo, with OCD and an orthopedic foot problem.  Paternal Family History Father, 45 yo, with hypertension, a suspected inguinal hernia with repair at 26 yo, and an unknown muscular condition as a child.  Cohan's father reports he was not expected to be able to walk and wore braces on his legs for his early childhood. Aunt, 12 yo, alive and well. Her three children, alive and well. Grandfather, deceased at 8 yo, with heart disease and an unknown cancer. Grandmother, 18 yo, with hypertension.  Maternal Family History Mother, 34 yo, with Crohn's disease. Uncle, 36 yo, with suspected autism spectrum disorder. His son with suspected autism spectrum disorder. Aunt, 26 yo, with Crohn's disease.  Her son, 65 yo, with an unknown clotting disorder. Grandfather, 31 yo, with hypertension, Crohn's disease, arthritis, and suspected autism spectrum disorder. Grandmother, 27 yo, with heart issues and melanoma of the upper arm at 81 yo. Her sister with cerebral palsy and intellectual disability.  Mother's ethnicity: Mixed European, [place] Father's ethnicity: Mixed European Consangunity: Denies  Prior Genetic testing: None  Genetic Counseling: William Herrera is a 28 y.o. male with a personal history of multiple osteochondromas and autism spectrum  disorder.  William Herrera was first noted to have osteochondromas early in life, around 26-26 years old.  He has been followed by William Herrera and [facility] for most of his life related to the osteochondromas.  He reports he has found, too many osteochondromas to count throughout his body with many being confirmed via imaging.  To date, he has required surgical removal of osteochondromas on three occasions, related to their location, interaction with muscles, and pain.  The osteochondromas on his knees cause the most discomfort.  We discussed that multiple osteochondromas can be associated with changes to the EXT1 and EXT2 genes, called Hereditary Multiple Osteochondromas (HMO).  Given William Herrera's symptoms, it is likely that he has a change to one of these genes and genetic testing is recommended.  William Herrera was interested in the inheritance of the condition given his parents are unaffected and had some concern for his future children.  We discussed that should he have HMO, there is a 50% chance that any of his future children would have the condition.  Given his family history, it is likely that this genetic change is de novo, or brand new, in William Herrera himself and not inherited from either of his parents.  William Herrera has also been diagnosed with autism spectrum disorder and describes himself as, high functioning. 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  In the past, he has experienced significant sensory sensitivities.  As a child he would sometimes bang his head on the ground or have meltdowns related to sensory overload.  He reports that he is better able to regulate himself as an adult and is also on medication that has helped significantly.  Genetic considerations were reviewed with the family. They are aware  that we have over 20,000 genes, each with an important role in the body. All of the genes are packaged into structures called chromosomes. We have  two copies of every chromosome- one that is inherited from each parent- and thus two copies of every gene. Given Sabian's features, concern for a genetic cause of his symptoms has arisen. If a specific genetic abnormality can be identified, it may help provide further insight into prognosis, management, and recurrence risk.  At this time, there is no specific genetic diagnosis evident in [place]. Given his complicated medical and developmental history, a broad approach to genetic testing is recommended. Specifically, we recommend whole exome sequencing (WES).   Whole exome sequencing assesses all of the coding regions (exons) of the genes for any variants that could be associated with an individual's symptoms. This testing would include sequencing of the EXT1 and EXT2 genes as well as many other autism-related genes.  The family is interested in pursuing this testing today and would like to know of secondary findings as well. Jailen's father will contact the clinic if he changes his mind about his own secondary findings report and decides to opt out. The consent form, possible results (positive, negative, and variant of uncertain significance), and expected timeline were reviewed. [place] would like to be considered for financial assistance by the genetic testing laboratory and submitted an application for the patient financial assistance program. Parental samples will be submitted for comparison. A sample was collected today from Arling and both of his parents to be sent to [facility] for [employer]. Results are expected in 1-2 months, at which point we will reach out with more information.   Recommendations: [facility] Exome Sequencing Continue follow-up with other healthcare providers as recommended.  Date: 12/04/2023 Total time spent: 75 minutes Genetic Counselor-only time: 30 minutes  Time spent includes face to face and non-face to face care for the patient on the date of this  encounter (history, genetic counseling, coordination of care, data gathering and/or documentation as outlined).   Lum Molt MS [facility] Certified Genetic Counselor [facility] [employer] [facility]

## 2023-12-04 NOTE — Patient Instructions (Signed)
 Recommendations: Trio exome sequencing through [facility] - results expected in 2 months. Continue follow up with current medical providers per their recommendations. Activities as tolerated  Follow up will be based on the results of the testing.  Thank you for allowing us  to be a part of your care. Please let us  know if there is anything else you need from us .  [facility]

## 2023-12-04 NOTE — Progress Notes (Signed)
 MEDICAL GENETICS NEW PATIENT EVALUATION  Patient name: William Herrera DOB: 10-19-1996 Age: 28 y.o. 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 We expect that he has changes to either EXT1 or EXT2, but it is unclear what would be the cause to his autism. Appropriate testing at this time would include exome sequencing; this would simultaneously evaluate thousands of individual genes for smaller changes, as well as the chromosomes for gains or losses of genetic material. William Herrera's family was interested in this being performed, and consent and samples were obtained for a trio exome sequencing study through [facility]. The results are expected in 1-2 months, and we will contact his family when they are available. [name] should otherwise continue his current medical care and current work and activities as tolerated.  Recommendations: Trio exome sequencing through [facility] - results expected in 2 months. Continue follow up with current medical providers per their recommendations. Activities as tolerated  Follow up will be based on the results of the testing.   HPI: William Herrera is a 28 y.o. assigned male at birth who presents today for an initial genetics evaluation for multiple osteochondromas. He is accompanied by his parents, who provided the history. This information, along with a review of pertinent records, labs, and radiology studies, is summarized below.  [name] has been followed for multiple osteochondromas since early in life (around 31-85 years old), when he first developed an exostosis on his leg. An X-ray from 2008 of his left wrist identified one on his distal radius. 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 He is now followed by [facility] (Dr. Sharl). He has had at least 3 surgeries for removal of his osteochondromas, the most recent being in 07/2023. Removal may related to the size and pain, and none of these were cancerous per report. He is concerned about the possibility that his future children could also inherit this condition.  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 He had significant sensory issues and emotional lability with head banging. He also lined up his toys. He received occupational therapy for several years to help with his focus and sensory issues. He continues to have sensory issues and is on trileptal with good improvement. He becomes frustrated at times when he is distracted. He completed ES with support/IEP. He did HS at [employer], and also took come classes at [employer]. He now works in a set designer job.  Past Medical History: Past Medical History:  Diagnosis Date   Autism    high functioning   Bone tumor (benign)    Past Surgical History:  Past Surgical History:  Procedure Laterality Date   DENTAL SURGERY     removal of bone tumors     Medications: Current Outpatient Medications on File Prior to Visit  Medication Sig Dispense Refill   OXcarbazepine (TRILEPTAL) 150 MG tablet Take 150 mg by mouth 2 (two) times daily.     No current facility-administered medications on file prior to visit.   Allergies:  Allergies  Allergen Reactions   Cashew Nut Oil    Other     Prickly pear juice    Peanut-Containing Drug Products  Immunizations: Up to date  Review of Systems: Negative except as noted in the HPI  Family History: Family History  Problem Relation Age of Onset   Crohn's disease Mother    Hypertension Father   Self-reported ancestry: Mixed European Consanguinity:  Denies Please see the genetic counselor note for additional information  Social History: Lives with by himself in [place]  Vitals: Weight: 161.2 lb Height: 5'5 Head circumference: 57.3 cm (94%)  Genetics Physical Exam:  Constitution: The patient is active and alert  Head: No abnormalities detected in: head, hairline, shape or size    Anterior fontanelle flat: not flat    Anterior fontanelle open: not open    Bitemporal narrowing: forehead not narrow    Frontal bossing: no frontal bossing    Macrocephaly: not macrocephalic    Microcephaly: not microcephalic    Plagiocephaly: not plagiocephalic  Face:    Midfacial hypoplasia: midfacial hypoplasia (comments: Round shape)  Eyes:    Upslanting palpebral fissure: upslanting palpebral fissure  Ears: (comments: Slight overfolding of posterior helices)  Nose: No abnormalities detected in: nose, nasal bridge or nasal tip    Bulbous nasal tip: no prominent nasal tip    Columella below nares: no columella below nares    Depressed nasal bridge: no depressed nasal bridge    Flat nasal bridge: no flat nasal bridge    Hypoplastic alae nasi: nasal alae not underdeveloped     Upturned nasal tip: non-upturned nasal tip  Mouth: No abnormalities detected in: palate (comments: Underbite, small chin)  Neck: No abnormalities detected in: neck    Cysts: no cysts    Pits: no pits in neck    Redundant nuchal skin: no redundant neck skin    Webbing: no webbed neck  Chest: No abnormalities detected in: chest, appearance, clavicles or scapulae    Inverted nipples: nipples not inverted    Pectus excavatum: no pectus excavatum  Cardiac: No abnormalities detected in: cardiovascular system    Abnormal distal perfusion: normal distal perfusion    Irregular rate: heart rate regular    Irregular rhythm: regular rhythm    Murmur: no murmur  Lungs: No abnormalities detected in: pulmonary system, bilateral auscultation or  effort  Abdomen: No abnormalities detected in: abdomen or appearance    Abnormal umbilicus: normal umbilicus    Diastasis recti: no diastasis recti    Distended abdomen: no distension    Hepatosplenomegaly: no hepatosplenomegaly    Umbilical hernia: no umbilical hernia  Spine: not assessed  Neurological: (comments: Brisk reflexes)  Genitourinary: not assessed  Hair, Nails, and Skin: No abnormalities detected in: integumentary system, hair, nails or skin    Abnormally healed scars: no abnormally healed scars    Birthmarks: no birthmarks    Lesions: no lesions  Extremities: (comments: Generalized joint hypermobility)  Hands and Feet: (comments: Few palpable osteochondromas (ex, small one on left middle finger))   Photo of patient available (verbal consent obtained)   Finn Amos Haldeman-Englert, MD [facility] Date: 12/04/2023 Time: 1130   Total time spent: 65 minutes Time spent includes face to face and non-face to face care for the patient on the date of this encounter (history and physical, genetic counseling, coordination of care, data gathering and/or documentation as outlined).  Genetic counselor: Lum Molt, MS, [facility]

## 2023-12-18 DIAGNOSIS — M25561 Pain in right knee: Secondary | ICD-10-CM | POA: Diagnosis not present

## 2023-12-27 DIAGNOSIS — M25561 Pain in right knee: Secondary | ICD-10-CM | POA: Diagnosis not present

## 2023-12-27 DIAGNOSIS — M6281 Muscle weakness (generalized): Secondary | ICD-10-CM | POA: Diagnosis not present

## 2023-12-30 DIAGNOSIS — M6281 Muscle weakness (generalized): Secondary | ICD-10-CM | POA: Diagnosis not present

## 2023-12-30 DIAGNOSIS — M25561 Pain in right knee: Secondary | ICD-10-CM | POA: Diagnosis not present

## 2023-12-31 ENCOUNTER — Encounter: Payer: Self-pay | Admitting: Genetic Counselor

## 2024-01-02 DIAGNOSIS — M25561 Pain in right knee: Secondary | ICD-10-CM | POA: Diagnosis not present

## 2024-01-02 DIAGNOSIS — M6281 Muscle weakness (generalized): Secondary | ICD-10-CM | POA: Diagnosis not present

## 2024-01-06 DIAGNOSIS — M6281 Muscle weakness (generalized): Secondary | ICD-10-CM | POA: Diagnosis not present

## 2024-01-06 DIAGNOSIS — M25561 Pain in right knee: Secondary | ICD-10-CM | POA: Diagnosis not present

## 2024-01-08 DIAGNOSIS — M25561 Pain in right knee: Secondary | ICD-10-CM | POA: Diagnosis not present

## 2024-01-08 DIAGNOSIS — M6281 Muscle weakness (generalized): Secondary | ICD-10-CM | POA: Diagnosis not present

## 2024-01-20 DIAGNOSIS — F840 Autistic disorder: Secondary | ICD-10-CM | POA: Diagnosis not present

## 2024-01-20 DIAGNOSIS — M25561 Pain in right knee: Secondary | ICD-10-CM | POA: Diagnosis not present

## 2024-01-20 DIAGNOSIS — D169 Benign neoplasm of bone and articular cartilage, unspecified: Secondary | ICD-10-CM | POA: Diagnosis not present

## 2024-01-20 DIAGNOSIS — M6281 Muscle weakness (generalized): Secondary | ICD-10-CM | POA: Diagnosis not present

## 2024-01-22 DIAGNOSIS — M6281 Muscle weakness (generalized): Secondary | ICD-10-CM | POA: Diagnosis not present

## 2024-01-22 DIAGNOSIS — M25561 Pain in right knee: Secondary | ICD-10-CM | POA: Diagnosis not present

## 2024-01-23 DIAGNOSIS — F0634 Mood disorder due to known physiological condition with mixed features: Secondary | ICD-10-CM | POA: Diagnosis not present

## 2024-01-23 DIAGNOSIS — F840 Autistic disorder: Secondary | ICD-10-CM | POA: Diagnosis not present

## 2024-01-27 DIAGNOSIS — Z91010 Allergy to peanuts: Secondary | ICD-10-CM | POA: Diagnosis not present

## 2024-01-27 DIAGNOSIS — H1045 Other chronic allergic conjunctivitis: Secondary | ICD-10-CM | POA: Diagnosis not present

## 2024-01-27 DIAGNOSIS — J3089 Other allergic rhinitis: Secondary | ICD-10-CM | POA: Diagnosis not present

## 2024-01-27 DIAGNOSIS — Z91018 Allergy to other foods: Secondary | ICD-10-CM | POA: Diagnosis not present

## 2024-01-28 DIAGNOSIS — M6281 Muscle weakness (generalized): Secondary | ICD-10-CM | POA: Diagnosis not present

## 2024-01-28 DIAGNOSIS — M25561 Pain in right knee: Secondary | ICD-10-CM | POA: Diagnosis not present

## 2024-01-29 DIAGNOSIS — M79604 Pain in right leg: Secondary | ICD-10-CM | POA: Diagnosis not present

## 2024-01-29 DIAGNOSIS — M25561 Pain in right knee: Secondary | ICD-10-CM | POA: Diagnosis not present

## 2024-02-03 DIAGNOSIS — J3081 Allergic rhinitis due to animal (cat) (dog) hair and dander: Secondary | ICD-10-CM | POA: Diagnosis not present

## 2024-02-03 DIAGNOSIS — J3089 Other allergic rhinitis: Secondary | ICD-10-CM | POA: Diagnosis not present

## 2024-02-04 DIAGNOSIS — J3081 Allergic rhinitis due to animal (cat) (dog) hair and dander: Secondary | ICD-10-CM | POA: Diagnosis not present

## 2024-02-13 DIAGNOSIS — J301 Allergic rhinitis due to pollen: Secondary | ICD-10-CM | POA: Diagnosis not present

## 2024-02-13 DIAGNOSIS — J3089 Other allergic rhinitis: Secondary | ICD-10-CM | POA: Diagnosis not present

## 2024-02-13 DIAGNOSIS — J3081 Allergic rhinitis due to animal (cat) (dog) hair and dander: Secondary | ICD-10-CM | POA: Diagnosis not present

## 2024-02-19 DIAGNOSIS — J3089 Other allergic rhinitis: Secondary | ICD-10-CM | POA: Diagnosis not present

## 2024-02-19 DIAGNOSIS — J301 Allergic rhinitis due to pollen: Secondary | ICD-10-CM | POA: Diagnosis not present

## 2024-02-19 DIAGNOSIS — J3081 Allergic rhinitis due to animal (cat) (dog) hair and dander: Secondary | ICD-10-CM | POA: Diagnosis not present

## 2024-02-21 DIAGNOSIS — J3081 Allergic rhinitis due to animal (cat) (dog) hair and dander: Secondary | ICD-10-CM | POA: Diagnosis not present

## 2024-02-21 DIAGNOSIS — J3089 Other allergic rhinitis: Secondary | ICD-10-CM | POA: Diagnosis not present

## 2024-02-21 DIAGNOSIS — J301 Allergic rhinitis due to pollen: Secondary | ICD-10-CM | POA: Diagnosis not present

## 2024-02-22 DIAGNOSIS — M79604 Pain in right leg: Secondary | ICD-10-CM | POA: Diagnosis not present

## 2024-02-24 DIAGNOSIS — J301 Allergic rhinitis due to pollen: Secondary | ICD-10-CM | POA: Diagnosis not present

## 2024-02-24 DIAGNOSIS — J3081 Allergic rhinitis due to animal (cat) (dog) hair and dander: Secondary | ICD-10-CM | POA: Diagnosis not present

## 2024-02-24 DIAGNOSIS — J3089 Other allergic rhinitis: Secondary | ICD-10-CM | POA: Diagnosis not present

## 2024-02-26 DIAGNOSIS — J3089 Other allergic rhinitis: Secondary | ICD-10-CM | POA: Diagnosis not present

## 2024-02-26 DIAGNOSIS — J3081 Allergic rhinitis due to animal (cat) (dog) hair and dander: Secondary | ICD-10-CM | POA: Diagnosis not present

## 2024-02-26 DIAGNOSIS — J301 Allergic rhinitis due to pollen: Secondary | ICD-10-CM | POA: Diagnosis not present

## 2024-03-02 ENCOUNTER — Telehealth: Payer: Self-pay | Admitting: Genetic Counselor

## 2024-03-02 NOTE — Progress Notes (Signed)
 Spoke with William Herrera's mother, upon his request, regarding the results of his recent genetic testing.      William Herrera was seen in the [facility] clinic on 12/04/2023 at 28 yo due to a personal history of multiple osteochondromas and autism spectrum disorder. After evaluation, genetic testing was ordered for William Herrera including exome sequencing.     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   This variant involves the duplication of two base pairs (CA) at position 1641 causing a translational frameshift with predicted alternate stop codon after 74 amino acids.  This alteration is expected to result in loss of function by premature protein truncation or nonsense-mediated mRNA decay.   EXT1-related multiple osteochondromas is characterized by the development of multiple osteochondromas, which are benign cartilage-capped tumors of the bone that eventually ossify at skeletal maturation, forearm deformities, and shortened stature less than the 50th percentile. Other features seen in a minority of patients include knee valgus deformity. The age of onset is approximately 3 years, but can range between infancy and 12 years and variable expressivity is observed. Reduced penetrance has been reported in females. Pathogenic EXT1 alterations are estimated to account for 65-70% of patients with hereditary multiple osteochondromas. Affected individuals have an increased risk for malignant transformation of exostoses to chondrosarcomas.   Management of EXT1-Related Multiple Osteochondromas typically includes excision of symptomatic osteochondromas.  For those with pelvic or scapular lesions, imaging of osteochondromas is recommended every 2-3 years due to increased risk of malignant degeneration in these  areas.  There is no clear recommendation for screening frequency in those without lesions in these areas. Frequency of imaging may be determined by need for excision of symptomatic lesions.   No genetic explanation for William Herrera diagnosis of autism spectrum disorder was identified on testing. We discussed that autism spectrum disorder is often multifactorial, meaning that a combination of genetic and environmental factors, together, cause symptoms rather than one single genetic condition.   No secondary findings were identified on this testing.   William Herrera expressed understanding of these results and was encouraged to reach out with any further questions. The test report has been released to the family and is attached to the associated order.      William Citron, MS [facility]   Certified Genetic Counselor

## 2024-03-04 DIAGNOSIS — J3089 Other allergic rhinitis: Secondary | ICD-10-CM | POA: Diagnosis not present

## 2024-03-04 DIAGNOSIS — J301 Allergic rhinitis due to pollen: Secondary | ICD-10-CM | POA: Diagnosis not present

## 2024-03-04 DIAGNOSIS — J3081 Allergic rhinitis due to animal (cat) (dog) hair and dander: Secondary | ICD-10-CM | POA: Diagnosis not present

## 2024-03-06 DIAGNOSIS — J3081 Allergic rhinitis due to animal (cat) (dog) hair and dander: Secondary | ICD-10-CM | POA: Diagnosis not present

## 2024-03-06 DIAGNOSIS — J3089 Other allergic rhinitis: Secondary | ICD-10-CM | POA: Diagnosis not present

## 2024-03-10 DIAGNOSIS — J301 Allergic rhinitis due to pollen: Secondary | ICD-10-CM | POA: Diagnosis not present

## 2024-03-10 DIAGNOSIS — M79604 Pain in right leg: Secondary | ICD-10-CM | POA: Diagnosis not present

## 2024-03-10 DIAGNOSIS — J3089 Other allergic rhinitis: Secondary | ICD-10-CM | POA: Diagnosis not present

## 2024-03-10 DIAGNOSIS — J3081 Allergic rhinitis due to animal (cat) (dog) hair and dander: Secondary | ICD-10-CM | POA: Diagnosis not present

## 2024-03-12 DIAGNOSIS — J3089 Other allergic rhinitis: Secondary | ICD-10-CM | POA: Diagnosis not present

## 2024-03-12 DIAGNOSIS — J3081 Allergic rhinitis due to animal (cat) (dog) hair and dander: Secondary | ICD-10-CM | POA: Diagnosis not present

## 2024-03-16 DIAGNOSIS — J3089 Other allergic rhinitis: Secondary | ICD-10-CM | POA: Diagnosis not present

## 2024-03-16 DIAGNOSIS — J3081 Allergic rhinitis due to animal (cat) (dog) hair and dander: Secondary | ICD-10-CM | POA: Diagnosis not present

## 2024-03-16 DIAGNOSIS — J301 Allergic rhinitis due to pollen: Secondary | ICD-10-CM | POA: Diagnosis not present

## 2024-03-18 DIAGNOSIS — J301 Allergic rhinitis due to pollen: Secondary | ICD-10-CM | POA: Diagnosis not present

## 2024-03-18 DIAGNOSIS — J3081 Allergic rhinitis due to animal (cat) (dog) hair and dander: Secondary | ICD-10-CM | POA: Diagnosis not present

## 2024-03-18 DIAGNOSIS — J3089 Other allergic rhinitis: Secondary | ICD-10-CM | POA: Diagnosis not present

## 2024-03-30 DIAGNOSIS — J301 Allergic rhinitis due to pollen: Secondary | ICD-10-CM | POA: Diagnosis not present

## 2024-03-30 DIAGNOSIS — J3081 Allergic rhinitis due to animal (cat) (dog) hair and dander: Secondary | ICD-10-CM | POA: Diagnosis not present

## 2024-03-30 DIAGNOSIS — J3089 Other allergic rhinitis: Secondary | ICD-10-CM | POA: Diagnosis not present

## 2024-04-01 DIAGNOSIS — H1032 Unspecified acute conjunctivitis, left eye: Secondary | ICD-10-CM | POA: Diagnosis not present

## 2024-04-03 DIAGNOSIS — J3081 Allergic rhinitis due to animal (cat) (dog) hair and dander: Secondary | ICD-10-CM | POA: Diagnosis not present

## 2024-04-03 DIAGNOSIS — J3089 Other allergic rhinitis: Secondary | ICD-10-CM | POA: Diagnosis not present

## 2024-04-03 DIAGNOSIS — J301 Allergic rhinitis due to pollen: Secondary | ICD-10-CM | POA: Diagnosis not present

## 2024-04-07 DIAGNOSIS — J3081 Allergic rhinitis due to animal (cat) (dog) hair and dander: Secondary | ICD-10-CM | POA: Diagnosis not present

## 2024-04-07 DIAGNOSIS — J3089 Other allergic rhinitis: Secondary | ICD-10-CM | POA: Diagnosis not present

## 2024-04-09 DIAGNOSIS — J3089 Other allergic rhinitis: Secondary | ICD-10-CM | POA: Diagnosis not present

## 2024-04-09 DIAGNOSIS — J301 Allergic rhinitis due to pollen: Secondary | ICD-10-CM | POA: Diagnosis not present

## 2024-04-09 DIAGNOSIS — J3081 Allergic rhinitis due to animal (cat) (dog) hair and dander: Secondary | ICD-10-CM | POA: Diagnosis not present

## 2024-04-13 DIAGNOSIS — J3089 Other allergic rhinitis: Secondary | ICD-10-CM | POA: Diagnosis not present

## 2024-04-13 DIAGNOSIS — J3081 Allergic rhinitis due to animal (cat) (dog) hair and dander: Secondary | ICD-10-CM | POA: Diagnosis not present

## 2024-04-13 DIAGNOSIS — J301 Allergic rhinitis due to pollen: Secondary | ICD-10-CM | POA: Diagnosis not present

## 2024-04-15 DIAGNOSIS — J3089 Other allergic rhinitis: Secondary | ICD-10-CM | POA: Diagnosis not present

## 2024-04-15 DIAGNOSIS — J3081 Allergic rhinitis due to animal (cat) (dog) hair and dander: Secondary | ICD-10-CM | POA: Diagnosis not present

## 2024-04-15 DIAGNOSIS — J301 Allergic rhinitis due to pollen: Secondary | ICD-10-CM | POA: Diagnosis not present

## 2024-04-22 DIAGNOSIS — J3081 Allergic rhinitis due to animal (cat) (dog) hair and dander: Secondary | ICD-10-CM | POA: Diagnosis not present

## 2024-04-22 DIAGNOSIS — J3089 Other allergic rhinitis: Secondary | ICD-10-CM | POA: Diagnosis not present

## 2024-04-22 DIAGNOSIS — J301 Allergic rhinitis due to pollen: Secondary | ICD-10-CM | POA: Diagnosis not present

## 2024-04-28 DIAGNOSIS — F0634 Mood disorder due to known physiological condition with mixed features: Secondary | ICD-10-CM | POA: Diagnosis not present

## 2024-04-28 DIAGNOSIS — F840 Autistic disorder: Secondary | ICD-10-CM | POA: Diagnosis not present

## 2024-04-29 DIAGNOSIS — J3089 Other allergic rhinitis: Secondary | ICD-10-CM | POA: Diagnosis not present

## 2024-04-29 DIAGNOSIS — J3081 Allergic rhinitis due to animal (cat) (dog) hair and dander: Secondary | ICD-10-CM | POA: Diagnosis not present

## 2024-04-29 DIAGNOSIS — J301 Allergic rhinitis due to pollen: Secondary | ICD-10-CM | POA: Diagnosis not present

## 2024-05-06 DIAGNOSIS — J3089 Other allergic rhinitis: Secondary | ICD-10-CM | POA: Diagnosis not present

## 2024-05-06 DIAGNOSIS — J301 Allergic rhinitis due to pollen: Secondary | ICD-10-CM | POA: Diagnosis not present

## 2024-05-06 DIAGNOSIS — J3081 Allergic rhinitis due to animal (cat) (dog) hair and dander: Secondary | ICD-10-CM | POA: Diagnosis not present

## 2024-05-13 DIAGNOSIS — J3081 Allergic rhinitis due to animal (cat) (dog) hair and dander: Secondary | ICD-10-CM | POA: Diagnosis not present

## 2024-05-13 DIAGNOSIS — J3089 Other allergic rhinitis: Secondary | ICD-10-CM | POA: Diagnosis not present

## 2024-05-13 DIAGNOSIS — J301 Allergic rhinitis due to pollen: Secondary | ICD-10-CM | POA: Diagnosis not present

## 2024-05-20 DIAGNOSIS — J3089 Other allergic rhinitis: Secondary | ICD-10-CM | POA: Diagnosis not present

## 2024-05-20 DIAGNOSIS — J301 Allergic rhinitis due to pollen: Secondary | ICD-10-CM | POA: Diagnosis not present

## 2024-05-20 DIAGNOSIS — J3081 Allergic rhinitis due to animal (cat) (dog) hair and dander: Secondary | ICD-10-CM | POA: Diagnosis not present

## 2024-05-27 DIAGNOSIS — Z91010 Allergy to peanuts: Secondary | ICD-10-CM | POA: Diagnosis not present

## 2024-05-27 DIAGNOSIS — J3081 Allergic rhinitis due to animal (cat) (dog) hair and dander: Secondary | ICD-10-CM | POA: Diagnosis not present

## 2024-05-27 DIAGNOSIS — Z91018 Allergy to other foods: Secondary | ICD-10-CM | POA: Diagnosis not present

## 2024-05-27 DIAGNOSIS — J3089 Other allergic rhinitis: Secondary | ICD-10-CM | POA: Diagnosis not present

## 2024-05-27 DIAGNOSIS — J301 Allergic rhinitis due to pollen: Secondary | ICD-10-CM | POA: Diagnosis not present

## 2024-05-27 DIAGNOSIS — H1045 Other chronic allergic conjunctivitis: Secondary | ICD-10-CM | POA: Diagnosis not present

## 2024-06-03 DIAGNOSIS — J3089 Other allergic rhinitis: Secondary | ICD-10-CM | POA: Diagnosis not present

## 2024-06-03 DIAGNOSIS — J301 Allergic rhinitis due to pollen: Secondary | ICD-10-CM | POA: Diagnosis not present

## 2024-06-03 DIAGNOSIS — J3081 Allergic rhinitis due to animal (cat) (dog) hair and dander: Secondary | ICD-10-CM | POA: Diagnosis not present

## 2024-06-10 DIAGNOSIS — J3089 Other allergic rhinitis: Secondary | ICD-10-CM | POA: Diagnosis not present

## 2024-06-10 DIAGNOSIS — J301 Allergic rhinitis due to pollen: Secondary | ICD-10-CM | POA: Diagnosis not present

## 2024-06-10 DIAGNOSIS — J3081 Allergic rhinitis due to animal (cat) (dog) hair and dander: Secondary | ICD-10-CM | POA: Diagnosis not present

## 2024-06-17 DIAGNOSIS — J069 Acute upper respiratory infection, unspecified: Secondary | ICD-10-CM | POA: Diagnosis not present

## 2024-06-23 DIAGNOSIS — J3089 Other allergic rhinitis: Secondary | ICD-10-CM | POA: Diagnosis not present

## 2024-06-23 DIAGNOSIS — J3081 Allergic rhinitis due to animal (cat) (dog) hair and dander: Secondary | ICD-10-CM | POA: Diagnosis not present

## 2024-06-24 DIAGNOSIS — J3081 Allergic rhinitis due to animal (cat) (dog) hair and dander: Secondary | ICD-10-CM | POA: Diagnosis not present

## 2024-06-25 DIAGNOSIS — J3089 Other allergic rhinitis: Secondary | ICD-10-CM | POA: Diagnosis not present

## 2024-06-25 DIAGNOSIS — J3081 Allergic rhinitis due to animal (cat) (dog) hair and dander: Secondary | ICD-10-CM | POA: Diagnosis not present

## 2024-06-25 DIAGNOSIS — J301 Allergic rhinitis due to pollen: Secondary | ICD-10-CM | POA: Diagnosis not present

## 2024-06-30 DIAGNOSIS — M25512 Pain in left shoulder: Secondary | ICD-10-CM | POA: Diagnosis not present

## 2024-07-02 DIAGNOSIS — J301 Allergic rhinitis due to pollen: Secondary | ICD-10-CM | POA: Diagnosis not present

## 2024-07-02 DIAGNOSIS — J3089 Other allergic rhinitis: Secondary | ICD-10-CM | POA: Diagnosis not present

## 2024-07-02 DIAGNOSIS — J3081 Allergic rhinitis due to animal (cat) (dog) hair and dander: Secondary | ICD-10-CM | POA: Diagnosis not present

## 2024-07-16 DIAGNOSIS — J301 Allergic rhinitis due to pollen: Secondary | ICD-10-CM | POA: Diagnosis not present

## 2024-07-16 DIAGNOSIS — J3089 Other allergic rhinitis: Secondary | ICD-10-CM | POA: Diagnosis not present

## 2024-07-16 DIAGNOSIS — J3081 Allergic rhinitis due to animal (cat) (dog) hair and dander: Secondary | ICD-10-CM | POA: Diagnosis not present

## 2024-07-23 DIAGNOSIS — J3081 Allergic rhinitis due to animal (cat) (dog) hair and dander: Secondary | ICD-10-CM | POA: Diagnosis not present

## 2024-07-23 DIAGNOSIS — J3089 Other allergic rhinitis: Secondary | ICD-10-CM | POA: Diagnosis not present

## 2024-07-23 DIAGNOSIS — J301 Allergic rhinitis due to pollen: Secondary | ICD-10-CM | POA: Diagnosis not present

## 2024-07-30 DIAGNOSIS — J3081 Allergic rhinitis due to animal (cat) (dog) hair and dander: Secondary | ICD-10-CM | POA: Diagnosis not present

## 2024-07-30 DIAGNOSIS — J3089 Other allergic rhinitis: Secondary | ICD-10-CM | POA: Diagnosis not present

## 2024-07-30 DIAGNOSIS — J301 Allergic rhinitis due to pollen: Secondary | ICD-10-CM | POA: Diagnosis not present

## 2024-07-31 DIAGNOSIS — R7401 Elevation of levels of liver transaminase levels: Secondary | ICD-10-CM | POA: Diagnosis not present

## 2024-07-31 DIAGNOSIS — F840 Autistic disorder: Secondary | ICD-10-CM | POA: Diagnosis not present

## 2024-07-31 DIAGNOSIS — Z0000 Encounter for general adult medical examination without abnormal findings: Secondary | ICD-10-CM | POA: Diagnosis not present

## 2024-07-31 DIAGNOSIS — Z91018 Allergy to other foods: Secondary | ICD-10-CM | POA: Diagnosis not present

## 2024-07-31 DIAGNOSIS — R7989 Other specified abnormal findings of blood chemistry: Secondary | ICD-10-CM | POA: Diagnosis not present

## 2024-07-31 DIAGNOSIS — Z6826 Body mass index (BMI) 26.0-26.9, adult: Secondary | ICD-10-CM | POA: Diagnosis not present

## 2024-07-31 DIAGNOSIS — Z79899 Other long term (current) drug therapy: Secondary | ICD-10-CM | POA: Diagnosis not present

## 2024-08-04 DIAGNOSIS — F0634 Mood disorder due to known physiological condition with mixed features: Secondary | ICD-10-CM | POA: Diagnosis not present

## 2024-08-04 DIAGNOSIS — F909 Attention-deficit hyperactivity disorder, unspecified type: Secondary | ICD-10-CM | POA: Diagnosis not present

## 2024-08-04 DIAGNOSIS — F840 Autistic disorder: Secondary | ICD-10-CM | POA: Diagnosis not present

## 2024-08-06 DIAGNOSIS — J3089 Other allergic rhinitis: Secondary | ICD-10-CM | POA: Diagnosis not present

## 2024-08-06 DIAGNOSIS — J3081 Allergic rhinitis due to animal (cat) (dog) hair and dander: Secondary | ICD-10-CM | POA: Diagnosis not present

## 2024-08-20 DIAGNOSIS — J301 Allergic rhinitis due to pollen: Secondary | ICD-10-CM | POA: Diagnosis not present

## 2024-08-20 DIAGNOSIS — J3081 Allergic rhinitis due to animal (cat) (dog) hair and dander: Secondary | ICD-10-CM | POA: Diagnosis not present

## 2024-08-20 DIAGNOSIS — J3089 Other allergic rhinitis: Secondary | ICD-10-CM | POA: Diagnosis not present

## 2024-09-03 DIAGNOSIS — J301 Allergic rhinitis due to pollen: Secondary | ICD-10-CM | POA: Diagnosis not present

## 2024-09-03 DIAGNOSIS — J3081 Allergic rhinitis due to animal (cat) (dog) hair and dander: Secondary | ICD-10-CM | POA: Diagnosis not present

## 2024-09-03 DIAGNOSIS — J3089 Other allergic rhinitis: Secondary | ICD-10-CM | POA: Diagnosis not present

## 2024-09-10 DIAGNOSIS — J301 Allergic rhinitis due to pollen: Secondary | ICD-10-CM | POA: Diagnosis not present

## 2024-09-10 DIAGNOSIS — J3081 Allergic rhinitis due to animal (cat) (dog) hair and dander: Secondary | ICD-10-CM | POA: Diagnosis not present

## 2024-09-10 DIAGNOSIS — J3089 Other allergic rhinitis: Secondary | ICD-10-CM | POA: Diagnosis not present

## 2024-09-17 DIAGNOSIS — J3089 Other allergic rhinitis: Secondary | ICD-10-CM | POA: Diagnosis not present

## 2024-09-17 DIAGNOSIS — J301 Allergic rhinitis due to pollen: Secondary | ICD-10-CM | POA: Diagnosis not present

## 2024-09-17 DIAGNOSIS — J3081 Allergic rhinitis due to animal (cat) (dog) hair and dander: Secondary | ICD-10-CM | POA: Diagnosis not present

## 2024-10-01 DIAGNOSIS — J301 Allergic rhinitis due to pollen: Secondary | ICD-10-CM | POA: Diagnosis not present

## 2024-10-01 DIAGNOSIS — J3089 Other allergic rhinitis: Secondary | ICD-10-CM | POA: Diagnosis not present

## 2024-10-01 DIAGNOSIS — J3081 Allergic rhinitis due to animal (cat) (dog) hair and dander: Secondary | ICD-10-CM | POA: Diagnosis not present

## 2024-10-08 DIAGNOSIS — J3089 Other allergic rhinitis: Secondary | ICD-10-CM | POA: Diagnosis not present

## 2024-10-08 DIAGNOSIS — J3081 Allergic rhinitis due to animal (cat) (dog) hair and dander: Secondary | ICD-10-CM | POA: Diagnosis not present

## 2024-10-08 DIAGNOSIS — J301 Allergic rhinitis due to pollen: Secondary | ICD-10-CM | POA: Diagnosis not present

## 2024-10-15 DIAGNOSIS — J3089 Other allergic rhinitis: Secondary | ICD-10-CM | POA: Diagnosis not present

## 2024-10-15 DIAGNOSIS — J301 Allergic rhinitis due to pollen: Secondary | ICD-10-CM | POA: Diagnosis not present

## 2024-10-15 DIAGNOSIS — J3081 Allergic rhinitis due to animal (cat) (dog) hair and dander: Secondary | ICD-10-CM | POA: Diagnosis not present
